# Patient Record
Sex: Male | Born: 1995 | Race: White | Hispanic: No | Marital: Single | Smoking: Current every day smoker
Health system: Southern US, Community
[De-identification: ages and names within clinical notes are randomized; demographics above are authoritative.]

## PROBLEM LIST (undated history)

## (undated) DIAGNOSIS — R9431 Abnormal electrocardiogram [ECG] [EKG]: Secondary | ICD-10-CM

## (undated) DIAGNOSIS — R011 Cardiac murmur, unspecified: Secondary | ICD-10-CM

## (undated) DIAGNOSIS — M898X1 Other specified disorders of bone, shoulder: Secondary | ICD-10-CM

## (undated) DIAGNOSIS — G8929 Other chronic pain: Secondary | ICD-10-CM

## (undated) DIAGNOSIS — K219 Gastro-esophageal reflux disease without esophagitis: Secondary | ICD-10-CM

## (undated) HISTORY — DX: Gastro-esophageal reflux disease without esophagitis: K21.9

## (undated) HISTORY — PX: CIRCUMCISION: SUR203

---
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
## 2013-06-23 ENCOUNTER — Emergency Department (HOSPITAL_COMMUNITY)
Admission: EM | Admit: 2013-06-23 | Discharge: 2013-06-23 | Disposition: A | Discharge status: Home / Self Care | Payer: Medicaid Other | Attending: Emergency Medicine | Admitting: Emergency Medicine

## 2013-06-23 ENCOUNTER — Encounter (HOSPITAL_COMMUNITY): Payer: Self-pay | Admitting: Emergency Medicine

## 2013-06-23 DIAGNOSIS — J069 Acute upper respiratory infection, unspecified: Secondary | ICD-10-CM

## 2013-06-23 DIAGNOSIS — M542 Cervicalgia: Secondary | ICD-10-CM | POA: Insufficient documentation

## 2013-06-23 DIAGNOSIS — K219 Gastro-esophageal reflux disease without esophagitis: Secondary | ICD-10-CM | POA: Insufficient documentation

## 2013-06-23 DIAGNOSIS — H6692 Otitis media, unspecified, left ear: Secondary | ICD-10-CM

## 2013-06-23 DIAGNOSIS — IMO0001 Reserved for inherently not codable concepts without codable children: Secondary | ICD-10-CM | POA: Insufficient documentation

## 2013-06-23 DIAGNOSIS — H6690 Otitis media, unspecified, unspecified ear: Secondary | ICD-10-CM | POA: Insufficient documentation

## 2013-06-23 DIAGNOSIS — Z79899 Other long term (current) drug therapy: Secondary | ICD-10-CM | POA: Insufficient documentation

## 2013-06-23 DIAGNOSIS — F17200 Nicotine dependence, unspecified, uncomplicated: Secondary | ICD-10-CM | POA: Insufficient documentation

## 2013-06-23 LAB — RAPID STREP SCREEN (MED CTR MEBANE ONLY): Streptococcus, Group A Screen (Direct): NEGATIVE

## 2013-06-23 MED ORDER — AMOXICILLIN 250 MG PO CAPS: 250.0000 mg | ORAL_CAPSULE | Freq: Three times a day (TID) | ORAL | Status: DC

## 2013-06-23 MED ORDER — ACETAMINOPHEN 325 MG PO TABS
650.0000 mg | ORAL_TABLET | Freq: Once | ORAL | Status: AC
  Administered 2013-06-23: 650 mg via ORAL
  Filled 2013-06-23: qty 2

## 2013-06-23 MED ORDER — LORATADINE 10 MG PO TABS: 10.0000 mg | ORAL_TABLET | Freq: Every day | ORAL | Status: DC

## 2013-06-23 NOTE — ED Notes (Signed)
Sore throat with chills, headache and back pain x 1 wk.

## 2013-06-23 NOTE — ED Provider Notes (Signed)
CSN: [id number]     Arrival date & time 06/23/13  1237 History   First MD Initiated Contact with Patient 06/23/13 1246     Chief Complaint  Patient presents with  . Sore Throat   (Consider location/radiation/quality/duration/timing/severity/associated sxs/prior Treatment) Patient is a 18 y.o. male presenting with URI.  URI Presenting symptoms: congestion, ear pain, fever and sore throat   Presenting symptoms: no cough and no facial pain   Severity:  Moderate Onset quality:  Gradual Duration:  1 week Timing:  Constant Progression:  Worsening Chronicity:  New Relieved by:  OTC medications Worsened by:  Eating Ineffective treatments:  OTC medications, rest and hot fluids Associated symptoms: headaches, myalgias, neck pain, sinus pain, sneezing, swollen glands and wheezing    Tyler Whitney is a 18 y.o. male who presents to the ED with sore throat, chills and aching x 1 week. He has taken motrin and it helps some.   Past Medical History  Diagnosis Date  . Abnormal EKG   . GERD (gastroesophageal reflux disease)    Past Surgical History  Procedure Laterality Date  . Circumcision     Family History  Problem Relation Age of Onset  . Diabetes Mother   . Hyperlipidemia Mother   . Cancer Mother 21    cervical  . Hypertension Father   . Asthma Sister   . Scoliosis Brother   . Eczema Brother   . Cancer Maternal Aunt     cervical  . Diabetes Maternal Aunt   . Diabetes Maternal Grandmother    History  Substance Use Topics  . Smoking status: Current Every Day Smoker -- 0.50 packs/day    Types: Cigarettes    Start date: 09/20/2011  . Smokeless tobacco: Never Used  . Alcohol Use: No    Review of Systems  Constitutional: Positive for fever.  HENT: Positive for congestion, ear pain, sneezing and sore throat.   Eyes: Negative for pain and visual disturbance.  Respiratory: Positive for wheezing. Negative for cough.   Gastrointestinal: Negative for nausea, vomiting and abdominal  pain.  Musculoskeletal: Positive for myalgias and neck pain. Negative for neck stiffness.  Skin: Negative for rash.  Neurological: Positive for headaches.  Psychiatric/Behavioral: Negative for confusion. The patient is not nervous/anxious.     Allergies  Review of patient's allergies indicates no known allergies.  Home Medications   Current Outpatient Rx  Name  Route  Sig  Dispense  Refill  . ibuprofen (ADVIL,MOTRIN) 200 MG tablet   Oral   Take 400 mg by mouth every 8 (eight) hours as needed for moderate pain.         . Cimetidine (ACID REDUCER PO)   Oral   Take 1 capsule by mouth.          BP 125/62  Pulse 77  Temp(Src) 98.8 F (37.1 C) (Oral)  Resp 18  Ht 5\' 11"  (1.803 m)  Wt 142 lb (64.411 kg)  BMI 19.81 kg/m2  SpO2 99% Physical Exam  Nursing note and vitals reviewed. Constitutional: He is oriented to person, place, and time. He appears well-developed and well-nourished.  HENT:  Head: Normocephalic and atraumatic.  Right Ear: Tympanic membrane normal.  Left Ear: Tympanic membrane is erythematous and retracted.  Nose: Mucosal edema and rhinorrhea present.  Mouth/Throat: Uvula is midline and mucous membranes are normal. Posterior oropharyngeal erythema present.  Eyes: Conjunctivae and EOM are normal. Pupils are equal, round, and reactive to light.  Neck: Neck supple. No spinous process tenderness and  no muscular tenderness present. No tracheal deviation present.  No meningeal signs  Cardiovascular: Normal rate and regular rhythm.   Pulmonary/Chest: Effort normal and breath sounds normal. He has no wheezes. He has no rales.  Abdominal: Soft. Bowel sounds are normal. There is no tenderness.  Musculoskeletal: Normal range of motion.  Lymphadenopathy:    He has no cervical adenopathy.  Neurological: He is alert and oriented to person, place, and time. No cranial nerve deficit.  Skin: Skin is warm and dry.  Psychiatric: He has a normal mood and affect. His behavior  is normal.    ED Course  Procedures  Results for orders placed during the hospital encounter of 06/23/13 (from the past 24 hour(s))  RAPID STREP SCREEN     Status: None   Collection Time    06/23/13  1:04 PM      Result Value Range   Streptococcus, Group A Screen (Direct) NEGATIVE  NEGATIVE    MDM  18 y.o. male with ear pain and URI symptoms. Will treat otitis media and URI symptoms.  Discussed with the patient and all questioned fully answered. He will return if any problems arise.    Medication List    TAKE these medications       amoxicillin 250 MG capsule  Commonly known as:  AMOXIL  Take 1 capsule (250 mg total) by mouth 3 (three) times daily.     loratadine 10 MG tablet  Commonly known as:  CLARITIN  Take 1 tablet (10 mg total) by mouth daily.      ASK your doctor about these medications       ACID REDUCER PO  Take 1 capsule by mouth.     ibuprofen 200 MG tablet  Commonly known as:  ADVIL,MOTRIN  Take 400 mg by mouth every 8 (eight) hours as needed for moderate pain.           [place] Orlene Och, [place] 06/23/13 [phone]

## 2013-06-23 NOTE — Discharge Instructions (Signed)
Cool Mist Vaporizers °Vaporizers may help relieve the symptoms of a cough and cold. They add moisture to the air, which helps mucus to become thinner and less sticky. This makes it easier to breathe and cough up secretions. Cool mist vaporizers do not cause serious burns like hot mist vaporizers ("steamers, humidifiers"). Vaporizers have not been proved to show they help with colds. You should not use a vaporizer if you are allergic to mold.  °HOME CARE INSTRUCTIONS °· Follow the package instructions for the vaporizer. °· Do not use anything other than distilled water in the vaporizer. °· Do not run the vaporizer all of the time. This can cause mold or bacteria to grow in the vaporizer. °· Clean the vaporizer after each time it is used. °· Clean and dry the vaporizer well before storing it. °· Stop using the vaporizer if worsening respiratory symptoms develop. °Document Released: 02/23/2004 Document Revised: 01/28/2013 Document Reviewed: 10/15/2012 °ExitCare® Patient Information ©2014 [employer]. ° °

## 2013-06-25 LAB — CULTURE, GROUP A STREP

## 2013-06-27 NOTE — ED Provider Notes (Signed)
Medical screening examination/treatment/procedure(s) were performed by non-physician practitioner and as supervising physician I was immediately available for consultation/collaboration.  EKG Interpretation   None        Lakira Ogando, MD 06/27/13 1550 

## 2013-08-13 ENCOUNTER — Encounter (HOSPITAL_COMMUNITY): Payer: Self-pay | Admitting: Emergency Medicine

## 2013-08-13 ENCOUNTER — Emergency Department (HOSPITAL_COMMUNITY)
Admission: EM | Admit: 2013-08-13 | Discharge: 2013-08-13 | Disposition: A | Discharge status: Home / Self Care | Payer: Medicaid Other | Attending: Emergency Medicine | Admitting: Emergency Medicine

## 2013-08-13 ENCOUNTER — Emergency Department (HOSPITAL_COMMUNITY): Payer: Medicaid Other

## 2013-08-13 DIAGNOSIS — Z8719 Personal history of other diseases of the digestive system: Secondary | ICD-10-CM | POA: Insufficient documentation

## 2013-08-13 DIAGNOSIS — W268XXA Contact with other sharp object(s), not elsewhere classified, initial encounter: Secondary | ICD-10-CM | POA: Insufficient documentation

## 2013-08-13 DIAGNOSIS — S91309A Unspecified open wound, unspecified foot, initial encounter: Secondary | ICD-10-CM | POA: Insufficient documentation

## 2013-08-13 DIAGNOSIS — Y929 Unspecified place or not applicable: Secondary | ICD-10-CM | POA: Insufficient documentation

## 2013-08-13 DIAGNOSIS — Z23 Encounter for immunization: Secondary | ICD-10-CM | POA: Insufficient documentation

## 2013-08-13 DIAGNOSIS — S91339A Puncture wound without foreign body, unspecified foot, initial encounter: Secondary | ICD-10-CM

## 2013-08-13 DIAGNOSIS — Z792 Long term (current) use of antibiotics: Secondary | ICD-10-CM | POA: Insufficient documentation

## 2013-08-13 DIAGNOSIS — Y9389 Activity, other specified: Secondary | ICD-10-CM | POA: Insufficient documentation

## 2013-08-13 DIAGNOSIS — F17200 Nicotine dependence, unspecified, uncomplicated: Secondary | ICD-10-CM | POA: Insufficient documentation

## 2013-08-13 MED ORDER — CIPROFLOXACIN HCL 250 MG PO TABS
ORAL_TABLET | ORAL | Status: AC
  Filled 2013-08-13: qty 1

## 2013-08-13 MED ORDER — CIPROFLOXACIN HCL 500 MG PO TABS: 500.0000 mg | ORAL_TABLET | Freq: Two times a day (BID) | ORAL | Status: DC

## 2013-08-13 MED ORDER — TRAMADOL HCL 50 MG PO TABS: 50.0000 mg | ORAL_TABLET | Freq: Four times a day (QID) | ORAL | Status: DC | PRN

## 2013-08-13 MED ORDER — CIPROFLOXACIN HCL 250 MG PO TABS
500.0000 mg | ORAL_TABLET | Freq: Once | ORAL | Status: AC
  Administered 2013-08-13: 500 mg via ORAL
  Filled 2013-08-13: qty 2

## 2013-08-13 MED ORDER — TETANUS-DIPHTH-ACELL PERTUSSIS 5-2.5-18.5 LF-MCG/0.5 IM SUSP
0.5000 mL | Freq: Once | INTRAMUSCULAR | Status: AC
  Administered 2013-08-13: 0.5 mL via INTRAMUSCULAR
  Filled 2013-08-13: qty 0.5

## 2013-08-13 MED ORDER — IBUPROFEN 600 MG PO TABS: 600.0000 mg | ORAL_TABLET | Freq: Four times a day (QID) | ORAL | Status: DC | PRN

## 2013-08-13 NOTE — Discharge Instructions (Signed)
Puncture Wound A puncture wound is an injury that extends through all layers of the skin and into the tissue beneath the skin (subcutaneous tissue). Puncture wounds become infected easily because germs often enter the body and go beneath the skin during the injury. Having a deep wound with a small entrance point makes it difficult for your caregiver to adequately clean the wound. This is especially true if you have stepped on a nail and it has passed through a dirty shoe or other situations where the wound is obviously contaminated. CAUSES  Many puncture wounds involve glass, nails, splinters, fish hooks, or other objects that enter the skin (foreign bodies). A puncture wound may also be caused by a human bite or animal bite. DIAGNOSIS  A puncture wound is usually diagnosed by your history and a physical exam. You may need to have an X-ray or an ultrasound to check for any foreign bodies still in the wound. TREATMENT   Your caregiver might prescribe antibiotic medicines.  You may need a follow-up visit to check on your wound. Follow all instructions as directed by your caregiver. HOME CARE INSTRUCTIONS   Change your dressing once per day, or as directed by your caregiver. If the dressing sticks, it may be removed by soaking the area in water.  If your caregiver has given you follow-up instructions, it is very important that you return for a follow-up appointment. Not following up as directed could result in a chronic or permanent injury, pain, and disability.  Only take over-the-counter or prescription medicines for pain, discomfort, or fever as directed by your caregiver.  If you are given antibiotics, take them as directed. Finish them even if you start to feel better. You may need a tetanus shot if:  You cannot remember when you had your last tetanus shot.  You have never had a tetanus shot. If you got a tetanus shot, your arm may swell, get red, and feel warm to the touch. This is common  and not a problem. If you need a tetanus shot and you choose not to have one, there is a rare chance of getting tetanus. Sickness from tetanus can be serious. You may need a rabies shot if an animal bite caused your puncture wound. SEEK MEDICAL CARE IF:   You have redness, swelling, or increasing pain in the wound.  You have red streaks going away from the wound.  You notice a bad smell coming from the wound or dressing.  You have yellowish-white fluid (pus) coming from the wound.  You are treated with an antibiotic for infection, but the infection is not getting better.  You notice something in the wound, such as rubber from your shoe, cloth, or another object.  You have a fever.  You have severe pain.  You have difficulty breathing.  You feel dizzy or faint.  You cannot stop vomiting.  You lose feeling, develop numbness, or cannot move a limb below the wound.  Your symptoms worsen. MAKE SURE YOU:  Understand these instructions.  Will watch your condition.  Will get help right away if you are not doing well or get worse. Document Released: 03/07/2005 Document Revised: 08/20/2011 Document Reviewed: 11/14/2010 [facility] Patient Information 2014 [place], [place].   Soak your foot twice daily for 20 minutes in warm epsom salt water as discussed.  Get rechecked if you develop any worsened symptoms as listed above.

## 2013-08-13 NOTE — ED Provider Notes (Signed)
CSN: [id number]     Arrival date & time 08/13/13  1259 History   First MD Initiated Contact with Patient 08/13/13 1311     Chief Complaint  Patient presents with  . Foot Injury     (Consider location/radiation/quality/duration/timing/severity/associated sxs/prior Treatment) HPI Comments: [name] is a 18 y.o. Male presenting with persistent pain in the heel of his right foot since stepping on a metal screw yesterday.  He was wearing shoes and the screw punctured the rubber of the show prior puncturing the skin.  He has had increased pain and has been walking on the ball of his foot to avoid pressure on the heel.  There has been no drainage from the wound and he has used alcohol rubs to keep the wound clean.  He denies other injury.  He is not utd with his tetanus.  He has found no alleviators.       The history is provided by the patient.    Past Medical History  Diagnosis Date  . Abnormal EKG   . GERD (gastroesophageal reflux disease)    Past Surgical History  Procedure Laterality Date  . Circumcision     Family History  Problem Relation Age of Onset  . Diabetes Mother   . Hyperlipidemia Mother   . Cancer Mother 21    cervical  . Hypertension Father   . Asthma Sister   . Scoliosis Brother   . Eczema Brother   . Cancer Maternal Aunt     cervical  . Diabetes Maternal Aunt   . Diabetes Maternal Grandmother    History  Substance Use Topics  . Smoking status: Current Every Day Smoker -- 0.50 packs/day    Types: Cigarettes    Start date: 09/20/2011  . Smokeless tobacco: Never Used  . Alcohol Use: No    Review of Systems  Constitutional: Negative for fever.  Musculoskeletal: Positive for arthralgias. Negative for joint swelling and myalgias.  Skin: Positive for wound. Negative for color change.  Neurological: Negative for weakness and numbness.      Allergies  Review of patient's allergies indicates no known allergies.  Home Medications   Current  Outpatient Rx  Name  Route  Sig  Dispense  Refill  . ciprofloxacin (CIPRO) 500 MG tablet   Oral   Take 1 tablet (500 mg total) by mouth 2 (two) times daily.   19 tablet   0   . ibuprofen (ADVIL,MOTRIN) 600 MG tablet   Oral   Take 1 tablet (600 mg total) by mouth every 6 (six) hours as needed.   15 tablet   0   . traMADol (ULTRAM) 50 MG tablet   Oral   Take 1 tablet (50 mg total) by mouth every 6 (six) hours as needed.   10 tablet   0    BP 146/65  Pulse 83  Temp(Src) 98.1 F (36.7 C) (Oral)  Resp 16  Ht 5\' 11"  (1.803 m)  Wt 142 lb (64.411 kg)  BMI 19.81 kg/m2  SpO2 96% Physical Exam  Constitutional: He is oriented to person, place, and time. He appears well-developed and well-nourished.  HENT:  Head: Normocephalic.  Cardiovascular: Normal rate.   Pulmonary/Chest: Effort normal.  Musculoskeletal: He exhibits tenderness. He exhibits no edema.  Small puncture right heel, no erythema, drainage, swelling, no palpable or visible fb.  ttp.  Neurological: He is alert and oriented to person, place, and time. No sensory deficit.  Skin: Laceration noted.    ED Course  Procedures (including critical care time) Labs Review Labs Reviewed - No data to display Imaging Review Dg Foot Complete Right  08/13/2013   CLINICAL DATA:  Pain post trauma  EXAM: RIGHT FOOT COMPLETE - 3+ VIEW  COMPARISON:  None.  FINDINGS: Frontal, oblique, and lateral views were obtained. There is no fracture or dislocation. Joint spaces appear intact. No erosive change. No radiopaque foreign body.  IMPRESSION: No abnormality noted.   Electronically Signed   By: Bretta Bang M.D.   On: 08/13/2013 13:38     EKG Interpretation None      MDM   Final diagnoses:  Puncture wound of heel    Patients labs and/or radiological studies were viewed and considered during the medical decision making and disposition process. No visible or palpable retained fb.  Given puncture through rubber,  Will cover with  cipro for pseudomonas coverage.  Crutches supplied,  Warm epsom salt soaks bid recommended.  Prn f/u if pain worsens, or swelling, redness drainage develops.    Burgess Amor, PA-C 08/13/13 1437

## 2013-08-13 NOTE — ED Notes (Signed)
Puncture wound to right foot. Pt states he stepped on a screw yesterday. NAD. Unsure of last tetanus.

## 2013-08-13 NOTE — ED Notes (Signed)
In to discharge pt. Pt states he needs something for pain

## 2013-08-14 NOTE — ED Provider Notes (Signed)
Medical screening examination/treatment/procedure(s) were performed by non-physician practitioner and as supervising physician I was immediately available for consultation/collaboration.   EKG Interpretation None       Tyler HutchingBrian Shateria Paternostro, MD 08/14/13 [phone]

## 2013-09-08 ENCOUNTER — Encounter (HOSPITAL_COMMUNITY): Payer: Self-pay | Admitting: Emergency Medicine

## 2013-09-08 ENCOUNTER — Emergency Department (HOSPITAL_COMMUNITY)
Admission: EM | Admit: 2013-09-08 | Discharge: 2013-09-08 | Discharge status: Against Medical Advice | Payer: Medicaid Other | Attending: Emergency Medicine | Admitting: Emergency Medicine

## 2013-09-08 DIAGNOSIS — T50901A Poisoning by unspecified drugs, medicaments and biological substances, accidental (unintentional), initial encounter: Secondary | ICD-10-CM

## 2013-09-08 DIAGNOSIS — F17200 Nicotine dependence, unspecified, uncomplicated: Secondary | ICD-10-CM | POA: Insufficient documentation

## 2013-09-08 DIAGNOSIS — R4182 Altered mental status, unspecified: Secondary | ICD-10-CM | POA: Insufficient documentation

## 2013-09-08 DIAGNOSIS — T424X4A Poisoning by benzodiazepines, undetermined, initial encounter: Secondary | ICD-10-CM | POA: Insufficient documentation

## 2013-09-08 DIAGNOSIS — T424X1A Poisoning by benzodiazepines, accidental (unintentional), initial encounter: Secondary | ICD-10-CM | POA: Insufficient documentation

## 2013-09-08 DIAGNOSIS — Z792 Long term (current) use of antibiotics: Secondary | ICD-10-CM | POA: Insufficient documentation

## 2013-09-08 DIAGNOSIS — Z8719 Personal history of other diseases of the digestive system: Secondary | ICD-10-CM | POA: Insufficient documentation

## 2013-09-08 DIAGNOSIS — Y9289 Other specified places as the place of occurrence of the external cause: Secondary | ICD-10-CM | POA: Insufficient documentation

## 2013-09-08 DIAGNOSIS — Y9389 Activity, other specified: Secondary | ICD-10-CM | POA: Insufficient documentation

## 2013-09-08 NOTE — ED Notes (Signed)
Pt denies any si/hi at this time.  

## 2013-09-08 NOTE — ED Notes (Signed)
Pt states he needs charcoal to drink because he took ativan. Pt unsure what time and how much he took.

## 2013-09-08 NOTE — ED Notes (Signed)
Pt walked out of facility with his girlfriend, EDP notified and IVC paperwork started and [employer] notified.
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
## 2014-03-07 ENCOUNTER — Encounter (HOSPITAL_COMMUNITY): Payer: Self-pay | Admitting: Emergency Medicine

## 2014-03-07 ENCOUNTER — Emergency Department (HOSPITAL_COMMUNITY)
Admission: EM | Admit: 2014-03-07 | Discharge: 2014-03-07 | Disposition: A | Discharge status: Home / Self Care | Payer: No Typology Code available for payment source | Attending: Emergency Medicine | Admitting: Emergency Medicine

## 2014-03-07 DIAGNOSIS — IMO0002 Reserved for concepts with insufficient information to code with codable children: Secondary | ICD-10-CM | POA: Insufficient documentation

## 2014-03-07 DIAGNOSIS — Z792 Long term (current) use of antibiotics: Secondary | ICD-10-CM | POA: Insufficient documentation

## 2014-03-07 DIAGNOSIS — Y9389 Activity, other specified: Secondary | ICD-10-CM | POA: Insufficient documentation

## 2014-03-07 DIAGNOSIS — F17200 Nicotine dependence, unspecified, uncomplicated: Secondary | ICD-10-CM | POA: Diagnosis not present

## 2014-03-07 DIAGNOSIS — S335XXA Sprain of ligaments of lumbar spine, initial encounter: Secondary | ICD-10-CM | POA: Diagnosis not present

## 2014-03-07 DIAGNOSIS — Z8719 Personal history of other diseases of the digestive system: Secondary | ICD-10-CM | POA: Diagnosis not present

## 2014-03-07 DIAGNOSIS — Y92410 Unspecified street and highway as the place of occurrence of the external cause: Secondary | ICD-10-CM | POA: Diagnosis not present

## 2014-03-07 DIAGNOSIS — Z791 Long term (current) use of non-steroidal anti-inflammatories (NSAID): Secondary | ICD-10-CM | POA: Insufficient documentation

## 2014-03-07 DIAGNOSIS — S39012A Strain of muscle, fascia and tendon of lower back, initial encounter: Secondary | ICD-10-CM

## 2014-03-07 MED ORDER — ONDANSETRON HCL 4 MG PO TABS: 4.0000 mg | ORAL_TABLET | Freq: Three times a day (TID) | ORAL | Status: DC | PRN

## 2014-03-07 MED ORDER — TRAMADOL HCL 50 MG PO TABS
50.0000 mg | ORAL_TABLET | Freq: Once | ORAL | Status: AC
  Administered 2014-03-07: 50 mg via ORAL
  Filled 2014-03-07: qty 1

## 2014-03-07 MED ORDER — CYCLOBENZAPRINE HCL 10 MG PO TABS: 10.0000 mg | ORAL_TABLET | Freq: Three times a day (TID) | ORAL | Status: DC | PRN

## 2014-03-07 MED ORDER — TRAMADOL HCL 50 MG PO TABS
50.0000 mg | ORAL_TABLET | Freq: Four times a day (QID) | ORAL | Status: DC | PRN
Start: 2014-03-07 — End: 2016-06-27
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
## 2014-03-09 NOTE — ED Provider Notes (Signed)
Medical screening examination/treatment/procedure(s) were performed by non-physician practitioner and as supervising physician I was immediately available for consultation/collaboration.   EKG Interpretation None       Melodi Happel, MD 03/09/14 2303 

## 2014-04-16 ENCOUNTER — Emergency Department (HOSPITAL_COMMUNITY): Payer: No Typology Code available for payment source

## 2014-04-16 ENCOUNTER — Emergency Department (HOSPITAL_COMMUNITY)
Admission: EM | Admit: 2014-04-16 | Discharge: 2014-04-16 | Disposition: A | Discharge status: Home / Self Care | Payer: No Typology Code available for payment source | Attending: Emergency Medicine | Admitting: Emergency Medicine

## 2014-04-16 ENCOUNTER — Encounter (HOSPITAL_COMMUNITY): Payer: Self-pay | Admitting: *Deleted

## 2014-04-16 DIAGNOSIS — Z79899 Other long term (current) drug therapy: Secondary | ICD-10-CM | POA: Diagnosis not present

## 2014-04-16 DIAGNOSIS — S3991XA Unspecified injury of abdomen, initial encounter: Secondary | ICD-10-CM | POA: Diagnosis not present

## 2014-04-16 DIAGNOSIS — Y9389 Activity, other specified: Secondary | ICD-10-CM | POA: Diagnosis not present

## 2014-04-16 DIAGNOSIS — Z720 Tobacco use: Secondary | ICD-10-CM | POA: Diagnosis not present

## 2014-04-16 DIAGNOSIS — T148XXA Other injury of unspecified body region, initial encounter: Secondary | ICD-10-CM

## 2014-04-16 DIAGNOSIS — Z8719 Personal history of other diseases of the digestive system: Secondary | ICD-10-CM | POA: Insufficient documentation

## 2014-04-16 DIAGNOSIS — S199XXA Unspecified injury of neck, initial encounter: Secondary | ICD-10-CM | POA: Diagnosis not present

## 2014-04-16 DIAGNOSIS — S3992XA Unspecified injury of lower back, initial encounter: Secondary | ICD-10-CM | POA: Insufficient documentation

## 2014-04-16 DIAGNOSIS — Z792 Long term (current) use of antibiotics: Secondary | ICD-10-CM | POA: Insufficient documentation

## 2014-04-16 DIAGNOSIS — Y92410 Unspecified street and highway as the place of occurrence of the external cause: Secondary | ICD-10-CM | POA: Insufficient documentation

## 2014-04-16 DIAGNOSIS — S299XXA Unspecified injury of thorax, initial encounter: Secondary | ICD-10-CM | POA: Insufficient documentation

## 2014-04-16 LAB — URINALYSIS, ROUTINE W REFLEX MICROSCOPIC
Bilirubin Urine: NEGATIVE
GLUCOSE, UA: NEGATIVE mg/dL
HGB URINE DIPSTICK: NEGATIVE
Ketones, ur: NEGATIVE mg/dL
Leukocytes, UA: NEGATIVE
Nitrite: NEGATIVE
PROTEIN: NEGATIVE mg/dL
Specific Gravity, Urine: <1.005 — ABNORMAL LOW (ref 1.005–1.030)
Urobilinogen, UA: 0.2 mg/dL (ref 0.0–1.0)
pH: 5.5 (ref 5.0–8.0)

## 2014-04-16 MED ORDER — IBUPROFEN 600 MG PO TABS: 600.0000 mg | ORAL_TABLET | Freq: Four times a day (QID) | ORAL | Status: DC | PRN

## 2014-04-16 MED ORDER — ONDANSETRON HCL 4 MG PO TABS: 4.0000 mg | ORAL_TABLET | Freq: Four times a day (QID) | ORAL | Status: DC

## 2014-04-16 MED ORDER — HYDROCODONE-ACETAMINOPHEN 5-325 MG PO TABS: 1.0000 | ORAL_TABLET | ORAL | Status: DC | PRN

## 2014-04-16 MED ORDER — CYCLOBENZAPRINE HCL 5 MG PO TABS: 5.0000 mg | ORAL_TABLET | Freq: Three times a day (TID) | ORAL | Status: DC | PRN

## 2014-04-16 MED ORDER — IBUPROFEN 800 MG PO TABS
800.0000 mg | ORAL_TABLET | Freq: Once | ORAL | Status: AC
  Administered 2014-04-16: 800 mg via ORAL
  Filled 2014-04-16: qty 1
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
## 2014-04-16 NOTE — ED Notes (Signed)
Patient has been ambulating hallway, going out into waiting room.  Appears to move w/out any discomfort.
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
## 2014-06-28 ENCOUNTER — Encounter (HOSPITAL_COMMUNITY): Payer: Self-pay | Admitting: Emergency Medicine

## 2014-06-28 ENCOUNTER — Emergency Department (HOSPITAL_COMMUNITY)
Admission: EM | Admit: 2014-06-28 | Discharge: 2014-06-28 | Disposition: A | Discharge status: Home / Self Care | Payer: No Typology Code available for payment source | Attending: Emergency Medicine | Admitting: Emergency Medicine

## 2014-06-28 DIAGNOSIS — S299XXA Unspecified injury of thorax, initial encounter: Secondary | ICD-10-CM | POA: Diagnosis not present

## 2014-06-28 DIAGNOSIS — M898X1 Other specified disorders of bone, shoulder: Secondary | ICD-10-CM

## 2014-06-28 DIAGNOSIS — Z79899 Other long term (current) drug therapy: Secondary | ICD-10-CM | POA: Insufficient documentation

## 2014-06-28 DIAGNOSIS — Y9389 Activity, other specified: Secondary | ICD-10-CM | POA: Insufficient documentation

## 2014-06-28 DIAGNOSIS — K029 Dental caries, unspecified: Secondary | ICD-10-CM | POA: Insufficient documentation

## 2014-06-28 DIAGNOSIS — Z792 Long term (current) use of antibiotics: Secondary | ICD-10-CM | POA: Insufficient documentation

## 2014-06-28 DIAGNOSIS — Y998 Other external cause status: Secondary | ICD-10-CM | POA: Diagnosis not present

## 2014-06-28 DIAGNOSIS — S4990XA Unspecified injury of shoulder and upper arm, unspecified arm, initial encounter: Secondary | ICD-10-CM | POA: Insufficient documentation

## 2014-06-28 DIAGNOSIS — Z8719 Personal history of other diseases of the digestive system: Secondary | ICD-10-CM | POA: Insufficient documentation

## 2014-06-28 DIAGNOSIS — Y92410 Unspecified street and highway as the place of occurrence of the external cause: Secondary | ICD-10-CM | POA: Diagnosis not present

## 2014-06-28 DIAGNOSIS — Z720 Tobacco use: Secondary | ICD-10-CM | POA: Diagnosis not present

## 2014-06-28 DIAGNOSIS — G8929 Other chronic pain: Secondary | ICD-10-CM | POA: Insufficient documentation

## 2014-06-28 DIAGNOSIS — K088 Other specified disorders of teeth and supporting structures: Secondary | ICD-10-CM | POA: Diagnosis present

## 2014-06-28 MED ORDER — NAPROXEN 500 MG PO TABS: 500.0000 mg | ORAL_TABLET | Freq: Two times a day (BID) | ORAL | Status: DC

## 2014-06-28 MED ORDER — PENICILLIN V POTASSIUM 500 MG PO TABS: 500.0000 mg | ORAL_TABLET | Freq: Three times a day (TID) | ORAL | Status: DC

## 2014-06-28 MED ORDER — HYDROCODONE-ACETAMINOPHEN 5-325 MG PO TABS: 1.0000 | ORAL_TABLET | Freq: Four times a day (QID) | ORAL | Status: DC | PRN
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
## 2014-06-28 NOTE — Discharge Instructions (Signed)
Back Pain, Adult Back pain is very common. The pain often gets better over time. The cause of back pain is usually not dangerous. Most people can learn to manage their back pain on their own.  HOME CARE   Stay active. Start with short walks on flat ground if you can. Try to walk farther each day.  Do not sit, drive, or stand in one place for more than 30 minutes. Do not stay in bed.  Do not avoid exercise or work. Activity can help your back heal faster.  Be careful when you bend or lift an object. Bend at your knees, keep the object close to you, and do not twist.  Sleep on a firm mattress. Lie on your side, and bend your knees. If you lie on your back, put a pillow under your knees.  Only take medicines as told by your doctor.  Put ice on the injured area.  Put ice in a plastic bag.  Place a towel between your skin and the bag.  Leave the ice on for 15-20 minutes, 03-04 times a day for the first 2 to 3 days. After that, you can switch between ice and heat packs.  Ask your doctor about back exercises or massage.  Avoid feeling anxious or stressed. Find good ways to deal with stress, such as exercise. GET HELP RIGHT AWAY IF:   Your pain does not go away with rest or medicine.  Your pain does not go away in 1 week.  You have new problems.  You do not feel well.  The pain spreads into your legs.  You cannot control when you poop (bowel movement) or pee (urinate).  Your arms or legs feel weak or lose feeling (numbness).  You feel sick to your stomach (nauseous) or throw up (vomit).  You have belly (abdominal) pain.  You feel like you may pass out (faint). MAKE SURE YOU:   Understand these instructions.  Will watch your condition.  Will get help right away if you are not doing well or get worse. Document Released: 11/14/2007 Document Revised: 08/20/2011 Document Reviewed: 09/29/2013 [facility] Patient Information 2015 [place], [place]. This information is not intended  to replace advice given to you by your health care provider. Make sure you discuss any questions you have with your health care provider. Dental Pain A tooth ache may be caused by cavities (tooth decay). Cavities expose the nerve of the tooth to air and hot or cold temperatures. It may come from an infection or abscess (also called a boil or furuncle) around your tooth. It is also often caused by dental caries (tooth decay). This causes the pain you are having. DIAGNOSIS  Your caregiver can diagnose this problem by exam. TREATMENT   If caused by an infection, it may be treated with medications which kill germs (antibiotics) and pain medications as prescribed by your caregiver. Take medications as directed.  Only take over-the-counter or prescription medicines for pain, discomfort, or fever as directed by your caregiver.  Whether the tooth ache today is caused by infection or dental disease, you should see your dentist as soon as possible for further care. SEEK MEDICAL CARE IF: The exam and treatment you received today has been provided on an emergency basis only. This is not a substitute for complete medical or dental care. If your problem worsens or new problems (symptoms) appear, and you are unable to meet with your dentist, call or return to this location. SEEK IMMEDIATE MEDICAL CARE IF:  You have a fever.  You develop redness and swelling of your face, jaw, or neck.  You are unable to open your mouth.  You have severe pain uncontrolled by pain medicine. MAKE SURE YOU:   Understand these instructions.  Will watch your condition.  Will get help right away if you are not doing well or get worse. Document Released: 05/28/2005 Document Revised: 08/20/2011 Document Reviewed: 01/14/2008 [facility] Patient Information 2015 [place]. This information is not intended to replace advice given to you by your health care provider. Make sure you discuss any questions you have with your  health care provider.

## 2016-01-26 ENCOUNTER — Encounter (HOSPITAL_COMMUNITY): Payer: Self-pay | Admitting: Emergency Medicine

## 2016-01-26 ENCOUNTER — Emergency Department (HOSPITAL_COMMUNITY)
Admission: EM | Admit: 2016-01-26 | Discharge: 2016-01-26 | Disposition: A | Discharge status: Home / Self Care | Payer: Self-pay | Attending: Emergency Medicine | Admitting: Emergency Medicine

## 2016-01-26 DIAGNOSIS — M62830 Muscle spasm of back: Secondary | ICD-10-CM | POA: Insufficient documentation

## 2016-01-26 DIAGNOSIS — F17210 Nicotine dependence, cigarettes, uncomplicated: Secondary | ICD-10-CM | POA: Insufficient documentation

## 2016-01-26 DIAGNOSIS — Z791 Long term (current) use of non-steroidal anti-inflammatories (NSAID): Secondary | ICD-10-CM | POA: Insufficient documentation

## 2016-01-26 DIAGNOSIS — Z79899 Other long term (current) drug therapy: Secondary | ICD-10-CM | POA: Insufficient documentation

## 2016-01-26 HISTORY — DX: Other specified disorders of bone, shoulder: M89.8X1

## 2016-01-26 HISTORY — DX: Other chronic pain: G89.29

## 2016-01-26 MED ORDER — CYCLOBENZAPRINE HCL 10 MG PO TABS
10.0000 mg | ORAL_TABLET | Freq: Once | ORAL | Status: AC
  Administered 2016-01-26: 10 mg via ORAL
  Filled 2016-01-26: qty 1

## 2016-01-26 MED ORDER — IBUPROFEN 400 MG PO TABS
400.0000 mg | ORAL_TABLET | Freq: Once | ORAL | Status: AC
  Administered 2016-01-26: 400 mg via ORAL
  Filled 2016-01-26: qty 1

## 2016-01-26 MED ORDER — NAPROXEN 250 MG PO TABS
250.0000 mg | ORAL_TABLET | Freq: Two times a day (BID) | ORAL | 0 refills | Status: DC | PRN
Start: 2016-01-26 — End: 2016-06-27

## 2016-01-26 MED ORDER — CYCLOBENZAPRINE HCL 10 MG PO TABS
10.0000 mg | ORAL_TABLET | Freq: Three times a day (TID) | ORAL | 0 refills | Status: DC | PRN
Start: 2016-01-26 — End: 2017-03-22
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
## 2016-01-26 NOTE — Discharge Instructions (Signed)
Take the prescriptions as directed.  Apply moist heat or ice to the area(s) of discomfort, for 15 minutes at a time, several times per day for the next few days.  Do not fall asleep on a heating or ice pack.  Call your regular medical doctor today to schedule a follow up appointment this week.  Return to the Emergency Department immediately if worsening. ° °

## 2016-01-26 NOTE — ED Triage Notes (Signed)
Pt reports upper back pain x1 week, denies injury.

## 2016-06-27 ENCOUNTER — Encounter (HOSPITAL_COMMUNITY): Payer: Self-pay | Admitting: Emergency Medicine

## 2016-06-27 DIAGNOSIS — Y9339 Activity, other involving climbing, rappelling and jumping off: Secondary | ICD-10-CM | POA: Insufficient documentation

## 2016-06-27 DIAGNOSIS — S9032XA Contusion of left foot, initial encounter: Secondary | ICD-10-CM | POA: Insufficient documentation

## 2016-06-27 DIAGNOSIS — Y999 Unspecified external cause status: Secondary | ICD-10-CM | POA: Insufficient documentation

## 2016-06-27 DIAGNOSIS — S6401XA Injury of ulnar nerve at wrist and hand level of right arm, initial encounter: Secondary | ICD-10-CM | POA: Insufficient documentation

## 2016-06-27 DIAGNOSIS — Y929 Unspecified place or not applicable: Secondary | ICD-10-CM | POA: Insufficient documentation

## 2016-06-27 DIAGNOSIS — W010XXA Fall on same level from slipping, tripping and stumbling without subsequent striking against object, initial encounter: Secondary | ICD-10-CM | POA: Insufficient documentation

## 2016-06-27 DIAGNOSIS — M25521 Pain in right elbow: Secondary | ICD-10-CM | POA: Insufficient documentation

## 2016-06-27 DIAGNOSIS — S93402A Sprain of unspecified ligament of left ankle, initial encounter: Secondary | ICD-10-CM | POA: Insufficient documentation

## 2016-06-27 DIAGNOSIS — F17210 Nicotine dependence, cigarettes, uncomplicated: Secondary | ICD-10-CM | POA: Insufficient documentation

## 2017-03-22 ENCOUNTER — Emergency Department (HOSPITAL_COMMUNITY)
Admission: EM | Admit: 2017-03-22 | Discharge: 2017-03-23 | Disposition: A | Discharge status: Home / Self Care | Payer: Self-pay | Attending: Emergency Medicine | Admitting: Emergency Medicine

## 2017-03-22 ENCOUNTER — Emergency Department (HOSPITAL_COMMUNITY): Payer: Self-pay

## 2017-03-22 ENCOUNTER — Encounter (HOSPITAL_COMMUNITY): Payer: Self-pay | Admitting: Emergency Medicine

## 2017-03-22 DIAGNOSIS — Y939 Activity, unspecified: Secondary | ICD-10-CM | POA: Insufficient documentation

## 2017-03-22 DIAGNOSIS — Y9289 Other specified places as the place of occurrence of the external cause: Secondary | ICD-10-CM | POA: Insufficient documentation

## 2017-03-22 DIAGNOSIS — F17210 Nicotine dependence, cigarettes, uncomplicated: Secondary | ICD-10-CM | POA: Insufficient documentation

## 2017-03-22 DIAGNOSIS — S060X9A Concussion with loss of consciousness of unspecified duration, initial encounter: Secondary | ICD-10-CM | POA: Insufficient documentation

## 2017-03-22 DIAGNOSIS — Y999 Unspecified external cause status: Secondary | ICD-10-CM | POA: Insufficient documentation

## 2017-03-22 HISTORY — DX: Cardiac murmur, unspecified: R01.1

## 2017-03-22 MED ORDER — NAPROXEN 500 MG PO TABS: 500.0000 mg | ORAL_TABLET | Freq: Two times a day (BID) | ORAL | 0 refills | Status: AC
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
## 2017-03-22 NOTE — Discharge Instructions (Signed)
Refer to the concussion instructions below.  Your CT and chest xray imaging are negative for a traumatic injury, but as discussed, your symptoms do suggest that you have a concussion. Get rechecked if you are not improved over the next 7-10 days.
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
## 2017-03-23 NOTE — ED Notes (Signed)
Pt alert & oriented x4, stable gait. Patient given discharge instructions, paperwork & prescription(s). Patient  instructed to stop at the registration desk to finish any additional paperwork. Patient verbalized understanding. Pt left department w/ no further questions. 

## 2019-09-24 IMAGING — CT CT HEAD W/O CM
3 series · 16 of 47 positions shown, 19 images · non-contrast
Comparison: None.

CLINICAL DATA: Head trauma

EXAM:
CT HEAD WITHOUT CONTRAST
TECHNIQUE: Contiguous axial images were obtained from the base of the skull
through the vertex without intravenous contrast.

[Series 2: head wo · axial · 0.46mm/px · z∈[+11,+136]mm · 10 of 31 slices shown, 13 images]
[im 3/31  brain]
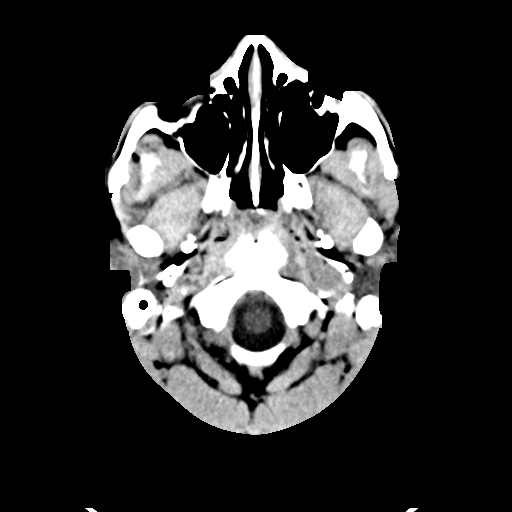
[im 3/31  bone]
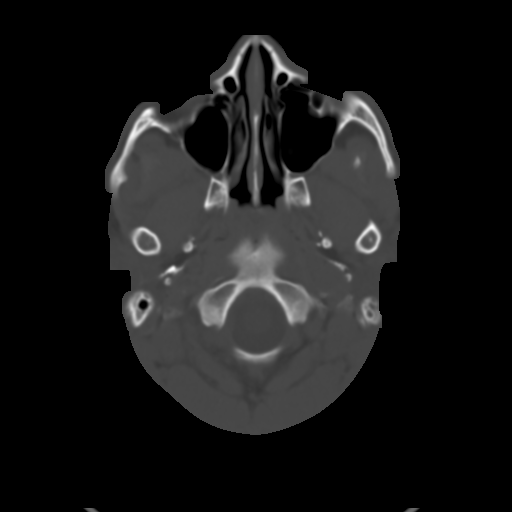
[im 6/31  brain]
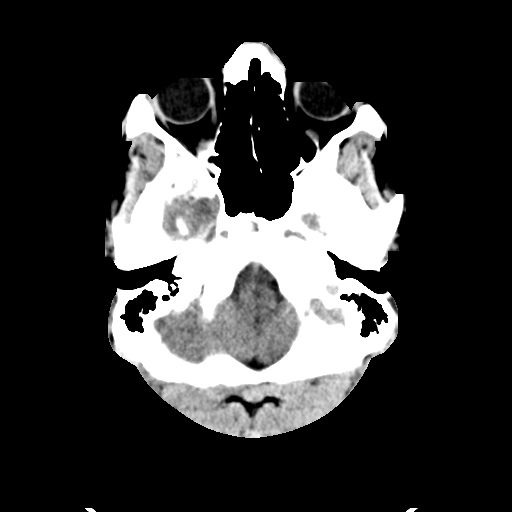
[im 9/31  brain]
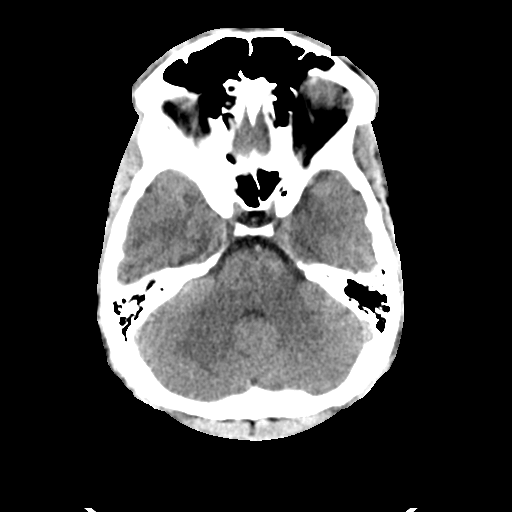
[im 11/31  brain]
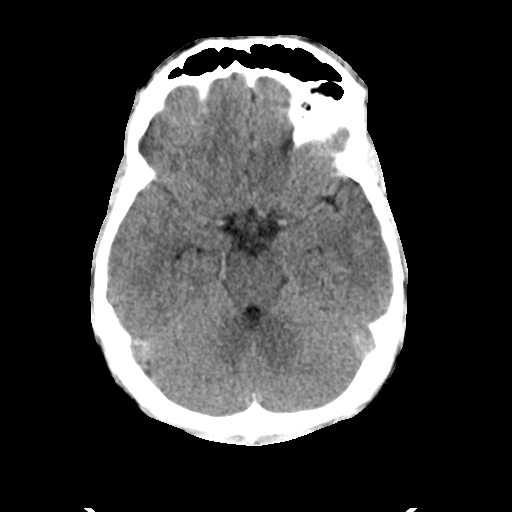
[im 14/31  brain]
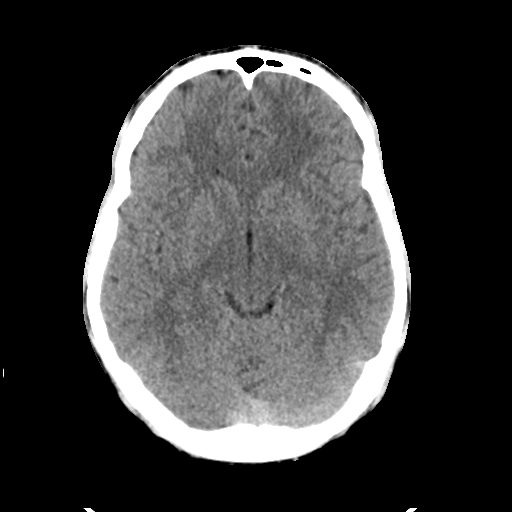
[im 14/31  bone]
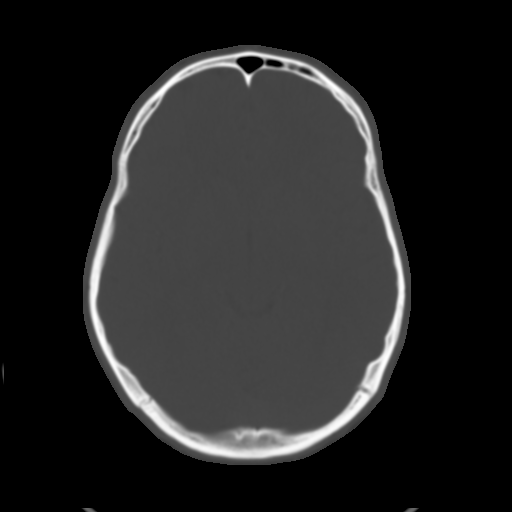
[im 17/31  brain]
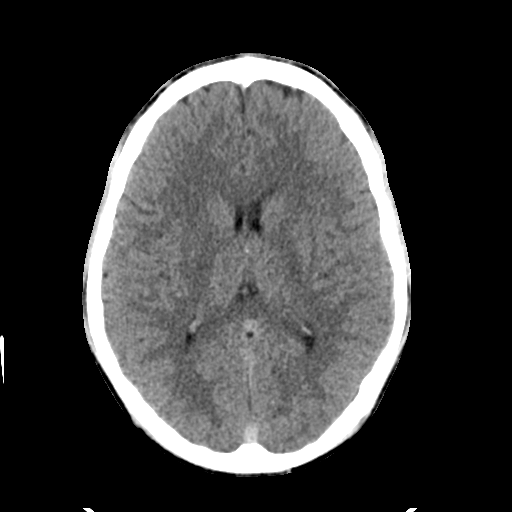
[im 20/31  brain]
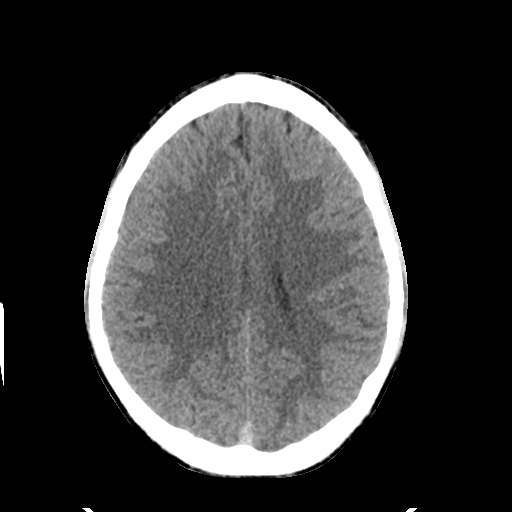
[im 23/31  brain]
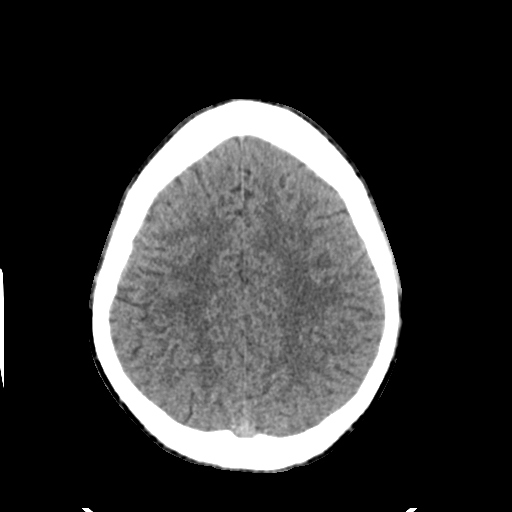
[im 25/31  brain]
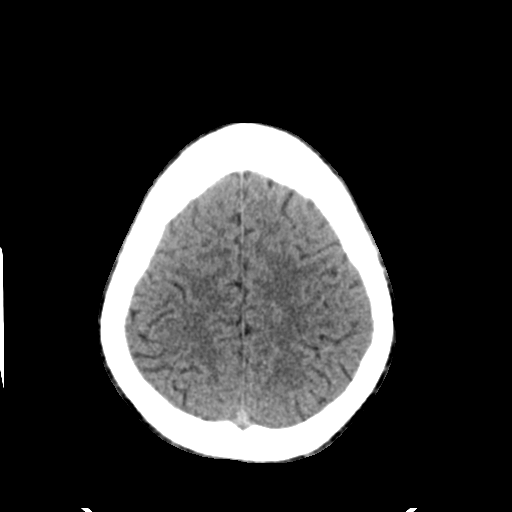
[im 25/31  bone]
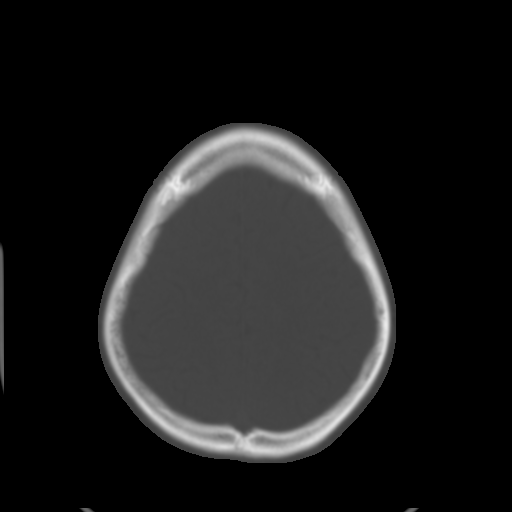
[im 28/31  brain]
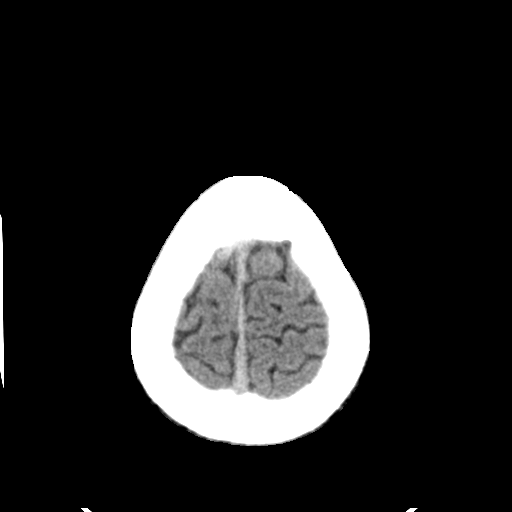

[Series 4: coronal soft tissue · coronal · 0.35mm/px · 3 of 75 slices shown]
[im 25/75  brain]
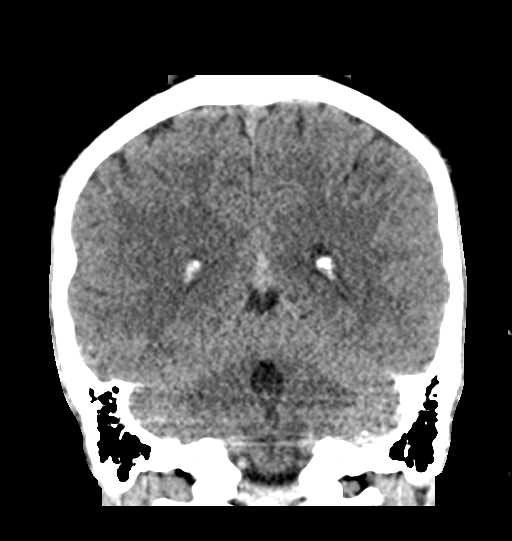
[im 33/75  brain]
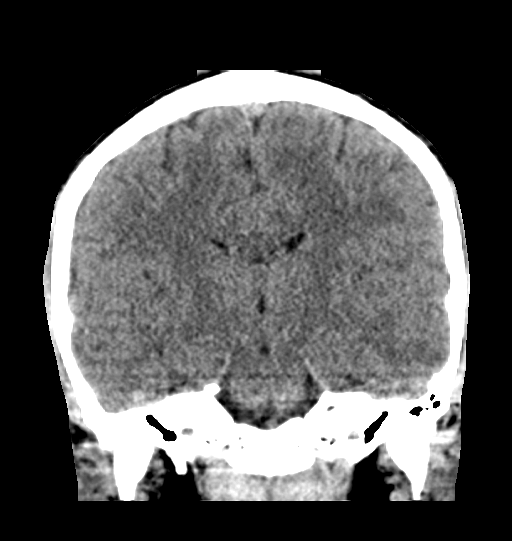
[im 42/75  brain]
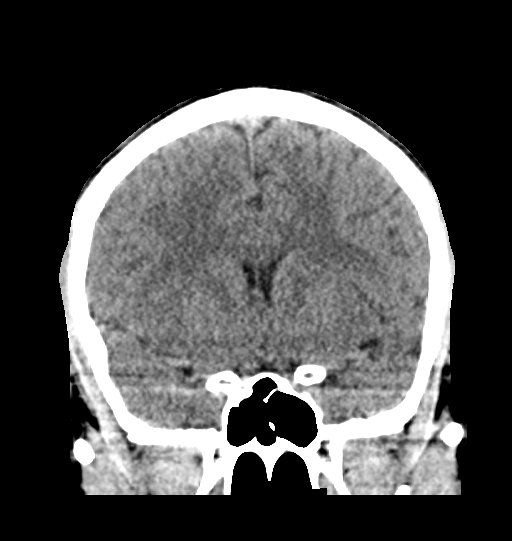

[Series 5: sagittal soft tissue · sagittal · 0.34mm/px · 3 of 57 slices shown]
[im 19/57  brain]
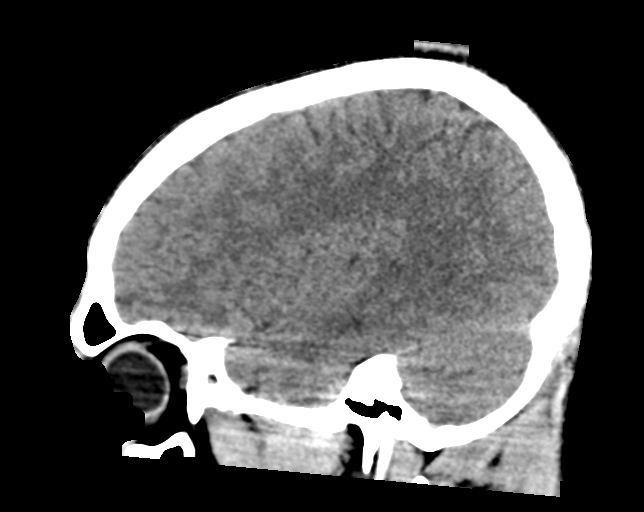
[im 29/57  brain]
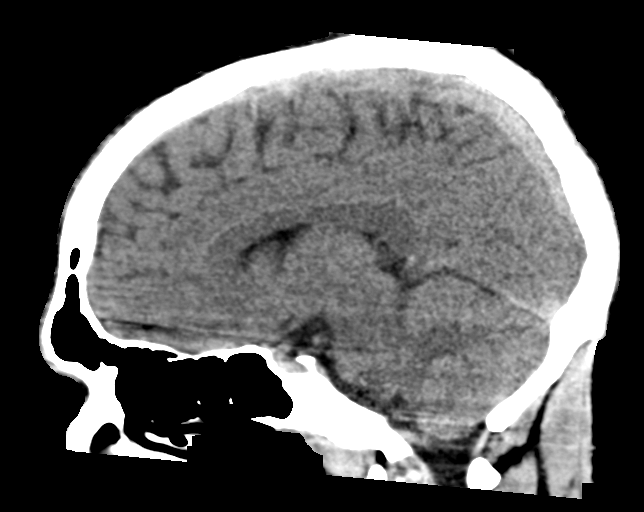
[im 38/57  brain]
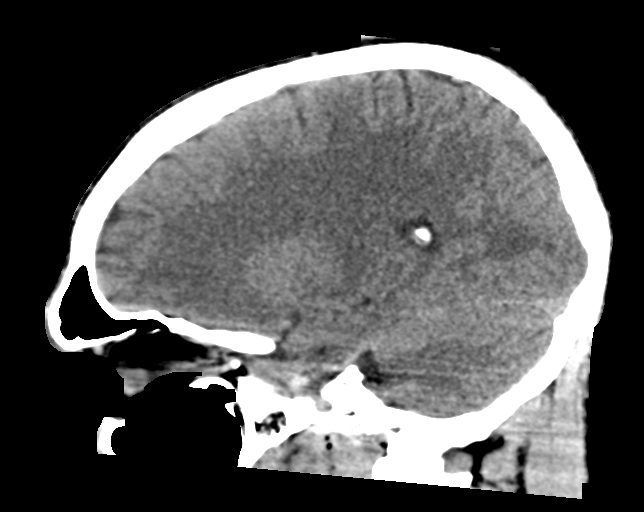

[16 of 47 positions shown; findings below may reference images not displayed]

FINDINGS: Brain: No mass lesion, intraparenchymal hemorrhage or extra-axial
collection. No evidence of acute cortical infarct. Brain parenchyma
and CSF-containing spaces are normal for age.

Vascular: No hyperdense vessel or unexpected calcification.

Skull: Normal visualized skull base, calvarium and extracranial soft
tissues.

Sinuses/Orbits: No sinus fluid levels or advanced mucosal
thickening. No mastoid effusion. Normal orbits.
IMPRESSION: Normal brain.  No skull fracture.
